# Patient Record
Sex: Male | Born: 1975 | Race: Black or African American | Hispanic: No | Marital: Single | Smoking: Current every day smoker
Health system: Southern US, Community
[De-identification: ages and names within clinical notes are randomized; demographics above are authoritative.]

---

## 2014-05-28 ENCOUNTER — Emergency Department (HOSPITAL_COMMUNITY)
Admission: EM | Admit: 2014-05-28 | Discharge: 2014-05-28 | Disposition: A | Discharge status: Home / Self Care | Payer: Self-pay | Attending: Emergency Medicine | Admitting: Emergency Medicine

## 2014-05-28 ENCOUNTER — Emergency Department (HOSPITAL_COMMUNITY): Payer: Self-pay

## 2014-05-28 ENCOUNTER — Encounter (HOSPITAL_COMMUNITY): Payer: Self-pay | Admitting: Emergency Medicine

## 2014-05-28 DIAGNOSIS — M549 Dorsalgia, unspecified: Secondary | ICD-10-CM | POA: Insufficient documentation

## 2014-05-28 DIAGNOSIS — L989 Disorder of the skin and subcutaneous tissue, unspecified: Secondary | ICD-10-CM | POA: Insufficient documentation

## 2014-05-28 DIAGNOSIS — Z720 Tobacco use: Secondary | ICD-10-CM | POA: Insufficient documentation

## 2014-05-28 DIAGNOSIS — R21 Rash and other nonspecific skin eruption: Secondary | ICD-10-CM | POA: Insufficient documentation

## 2014-05-28 DIAGNOSIS — R109 Unspecified abdominal pain: Secondary | ICD-10-CM | POA: Insufficient documentation

## 2014-05-28 LAB — CBC WITH DIFFERENTIAL/PLATELET
BASOS PCT: 0 % (ref 0–1)
Basophils Absolute: 0 10*3/uL (ref 0.0–0.1)
EOS ABS: 0.4 10*3/uL (ref 0.0–0.7)
Eosinophils Relative: 4 % (ref 0–5)
HCT: 44.5 % (ref 39.0–52.0)
Hemoglobin: 14.4 g/dL (ref 13.0–17.0)
Lymphocytes Relative: 28 % (ref 12–46)
Lymphs Abs: 3.1 10*3/uL (ref 0.7–4.0)
MCH: 28.3 pg (ref 26.0–34.0)
MCHC: 32.4 g/dL (ref 30.0–36.0)
MCV: 87.6 fL (ref 78.0–100.0)
MONOS PCT: 5 % (ref 3–12)
Monocytes Absolute: 0.5 10*3/uL (ref 0.1–1.0)
NEUTROS PCT: 63 % (ref 43–77)
Neutro Abs: 6.9 10*3/uL (ref 1.7–7.7)
PLATELETS: 226 10*3/uL (ref 150–400)
RBC: 5.08 MIL/uL (ref 4.22–5.81)
RDW: 12.7 % (ref 11.5–15.5)
WBC: 11 10*3/uL — ABNORMAL HIGH (ref 4.0–10.5)

## 2014-05-28 LAB — COMPREHENSIVE METABOLIC PANEL
ALT: 15 U/L (ref 0–53)
ANION GAP: 11 (ref 5–15)
AST: 23 U/L (ref 0–37)
Albumin: 4.1 g/dL (ref 3.5–5.2)
Alkaline Phosphatase: 63 U/L (ref 39–117)
BUN: 7 mg/dL (ref 6–23)
CALCIUM: 9.2 mg/dL (ref 8.4–10.5)
CO2: 26 mEq/L (ref 19–32)
Chloride: 102 mEq/L (ref 96–112)
Creatinine, Ser: 1.02 mg/dL (ref 0.50–1.35)
GFR calc Af Amer: >90 mL/min (ref >90)
GFR calc non Af Amer: >90 mL/min (ref >90)
Glucose, Bld: 78 mg/dL (ref 70–99)
Potassium: 3.7 mEq/L (ref 3.7–5.3)
SODIUM: 139 meq/L (ref 137–147)
TOTAL PROTEIN: 7.6 g/dL (ref 6.0–8.3)
Total Bilirubin: 0.2 mg/dL — ABNORMAL LOW (ref 0.3–1.2)

## 2014-05-28 LAB — URINALYSIS, ROUTINE W REFLEX MICROSCOPIC
Bilirubin Urine: NEGATIVE
Glucose, UA: NEGATIVE mg/dL
Hgb urine dipstick: NEGATIVE
Ketones, ur: NEGATIVE mg/dL
Leukocytes, UA: NEGATIVE
Nitrite: NEGATIVE
Protein, ur: NEGATIVE mg/dL
Specific Gravity, Urine: 1.006 (ref 1.005–1.030)
Urobilinogen, UA: 0.2 mg/dL (ref 0.0–1.0)
pH: 7 (ref 5.0–8.0)

## 2014-05-28 MED ORDER — ACYCLOVIR 400 MG PO TABS: 400.0000 mg | ORAL_TABLET | Freq: Three times a day (TID) | ORAL | Status: DC

## 2014-05-28 NOTE — ED Provider Notes (Signed)
CSN: [id number]     Arrival date & time 05/28/14  1817 History   First MD Initiated Contact with Patient 05/28/14 1933     Chief Complaint  Patient presents with  . Flank Pain  . Penile Discharge     (Consider location/radiation/quality/duration/timing/severity/associated sxs/prior Treatment) HPI Mark Richardson is a 38 y.o. male who presents to emergency department complaining of right flank pain. Patient states he began having pain 2 weeks ago, states at first pain was intermittent. Now pain is constant. Pain is not worsened with movement. Denies pain radiating in to the abdomen. Denies any nausea or vomiting, but states he has no appetite. Denies any urinary symptoms or hematuria. He states he did notice that he had some white penile discharge and a rash on the shaft of his penis. This started several days ago. Rash is painful. No history of the same. He did not try any medications prior to coming in.  History reviewed. No pertinent past medical history. History reviewed. No pertinent past surgical history. No family history on file. History  Substance Use Topics  . Smoking status: Current Every Day Smoker  . Smokeless tobacco: Not on file  . Alcohol Use: No    Review of Systems  Constitutional: Negative for fever and chills.  Respiratory: Negative for cough, chest tightness and shortness of breath.   Cardiovascular: Negative for chest pain, palpitations and leg swelling.  Gastrointestinal: Positive for abdominal pain. Negative for nausea, vomiting, diarrhea and abdominal distention.  Genitourinary: Positive for flank pain, discharge and genital sores. Negative for dysuria, urgency, frequency, hematuria and testicular pain.  Musculoskeletal: Positive for back pain. Negative for myalgias, arthralgias, neck pain and neck stiffness.  Skin: Negative for rash.  Allergic/Immunologic: Negative for immunocompromised state.  Neurological: Negative for dizziness, weakness,  light-headedness, numbness and headaches.      Allergies  Review of patient's allergies indicates no known allergies.  Home Medications   Prior to Admission medications   Not on File   BP 130/82 mmHg  Pulse 58  Temp(Src) 98.2 F (36.8 C) (Oral)  Resp 18  Ht 6\' 3"  (1.905 m)  Wt 225 lb (102.059 kg)  BMI 28.12 kg/m2  SpO2 98% Physical Exam  Constitutional: He is oriented to person, place, and time. He appears well-developed and well-nourished. No distress.  HENT:  Head: Normocephalic and atraumatic.  Eyes: Conjunctivae are normal.  Neck: Neck supple.  Cardiovascular: Normal rate, regular rhythm and normal heart sounds.   Pulmonary/Chest: Effort normal. No respiratory distress. He has no wheezes. He has no rales.  Abdominal: Soft. Bowel sounds are normal. He exhibits no distension. There is no tenderness. There is no rebound and no guarding.  No CVA tenderness bilaterally  Genitourinary:  vesicular and ulcerated rash to the shaft of the penis. Tender.   Musculoskeletal: He exhibits no edema.  Neurological: He is alert and oriented to person, place, and time.  Skin: Skin is warm and dry.  Nursing note and vitals reviewed.   ED Course  Procedures (including critical care time) Labs Review Labs Reviewed  CBC WITH DIFFERENTIAL - Abnormal; Notable for the following:    WBC 11.0 (*)    All other components within normal limits  COMPREHENSIVE METABOLIC PANEL - Abnormal; Notable for the following:    Total Bilirubin 0.2 (*)    All other components within normal limits  GC/CHLAMYDIA PROBE AMP  HERPES SIMPLEX VIRUS CULTURE  URINALYSIS, ROUTINE W REFLEX MICROSCOPIC  RPR  HIV ANTIBODY (ROUTINE TESTING)  Imaging Review Ct Abdomen Pelvis Wo Contrast  05/28/2014   CLINICAL DATA:  Right flank pain for the past 3 weeks  EXAM: CT ABDOMEN AND PELVIS WITHOUT CONTRAST  TECHNIQUE: Multidetector CT imaging of the abdomen and pelvis was performed following the standard protocol  without IV contrast.  COMPARISON:  None  FINDINGS: Lower chest:  No pleural effusion identified.  Hepatobiliary: No focal liver abnormality. The gallbladder is normal. No biliary dilatation.  Pancreas: Normal appearance of the pancreas.  Spleen: The spleen appears normal.  Adrenals/Urinary Tract: The adrenal glands are both normal. The kidneys are both normal. No nephrolithiasis or hydronephrosis. No hydroureter identified. The urinary bladder appears normal.  Stomach/Bowel: The stomach is within normal limits. The small bowel loops have a normal course and caliber. No obstruction. Normal appearance of the colon. Fusiform dilatation of the appendix is identified measuring up to 15 mm. There is debris and high attenuation material within the lumen of the appendix.  Vascular/Lymphatic: Normal appearance of the abdominal aorta. No enlarged retroperitoneal or mesenteric adenopathy. No enlarged pelvic or inguinal lymph nodes.  Reproductive: The prostate gland and seminal vesicles are on unremarkable.  Other: There is no free fluid or fluid collections identified within the abdomen or pelvis.  Musculoskeletal: Review of the visualized osseous structures are unremarkable.  IMPRESSION: 1. Marked fusiform dilatation of the appendix which measures up to 1.5 cm. Although there is no surrounding fat stranding and some foci of gas are noted within the lumen of the appendix, acute appendicitis is of primary concern. Careful clinical correlation is recommended. 2. No evidence for appendiceal perforation or abscess. 3. Negative for nephrolithiasis or obstructive uropathy.   Electronically Signed   By: Signa Kellaylor  Stroud M.D.   On: 05/28/2014 21:20     EKG Interpretation None      MDM   Final diagnoses:  Flank pain  Penile rash    Pt with right flank pain, intermittent for several weeks, worse within last few days. Some penile rash as well. Pt has no abdominal tenderness. Will check US, labs, CT abd for stone evaluation.     10:22 PM CT scan showing possible appendicitis although no straining around the appendix. I reassessed the patient, no right lower quadrant tenderness. In fact no tenderness over entire abdomen. Patient is nontoxic-appearing. I discussed case with Dr. Lindie SpruceWyatt for general surgery, who advised to discharge patient to bring him back in 24 hours for recheck. I discussed this with patient, he is agreeable to this treatment. I have also decided to treat patient with a cycle of year for his penile rash. I did collect some viral cultures. GC chlamydia sent as well. Urinalysis unremarkable. Will discharge home and bring him back in 24 hours for recheck. Term precautions discussed with patient  Filed Vitals:   05/28/14 2145  BP: 133/84  Pulse: 50  Temp:   Resp: [address]15     Vyolet Sakuma A Taiwan Millon, PA-C 05/29/14 0026  Shon Batonourtney F Horton, MD 05/29/14 1210

## 2014-05-28 NOTE — ED Notes (Signed)
Pt to ED for evaluation of right flank pain for the past 3 weeks, denies pain with urination or N/V.  Pt would also like to be evaluated for red bumps to genital area and penile discharge.

## 2014-05-28 NOTE — ED Notes (Addendum)
Pt A&OX4, ambulatory at d/c with steady gait, NAD, and states he understands discharge instructions and follow up care.

## 2014-05-28 NOTE — Discharge Instructions (Signed)
Acyclovir for possible herpes infection. Take for 10 days. Your cultures are pending.  RETURN TOMORROW AFTERNOON for recheck of your abdominal pain. Return earlier if worsening.    Flank Pain Flank pain refers to pain that is located on the side of the body between the upper abdomen and the back. The pain may occur over a short period of time (acute) or may be long-term or reoccurring (chronic). It may be mild or severe. Flank pain can be caused by many things. CAUSES  Some of the more common causes of flank pain include:  Muscle strains.   Muscle spasms.   A disease of your spine (vertebral disk disease).   A lung infection (pneumonia).   Fluid around your lungs (pulmonary edema).   A kidney infection.   Kidney stones.   A very painful skin rash caused by the chickenpox virus (shingles).   Gallbladder disease.  HOME CARE INSTRUCTIONS  Home care will depend on the cause of your pain. In general,  Rest as directed by your caregiver.  Drink enough fluids to keep your urine clear or pale yellow.  Only take over-the-counter or prescription medicines as directed by your caregiver. Some medicines may help relieve the pain.  Tell your caregiver about any changes in your pain.  Follow up with your caregiver as directed. SEEK IMMEDIATE MEDICAL CARE IF:   Your pain is not controlled with medicine.   You have new or worsening symptoms.  Your pain increases.   You have abdominal pain.   You have shortness of breath.   You have persistent nausea or vomiting.   You have swelling in your abdomen.   You feel faint or pass out.   You have blood in your urine.  You have a fever or persistent symptoms for more than 2-3 days.  You have a fever and your symptoms suddenly get worse. MAKE SURE YOU:   Understand these instructions.  Will watch your condition.  Will get help right away if you are not doing well or get worse. Document Released: 08/13/2005  Document Revised: 03/16/2012 Document Reviewed: 02/04/2012 ExitCare Patient Information 2015 ExitCare, L Genital Herpes Genital herpes is a sexually transmitted disease. This means that it is a disease passed by having sex with an infected person. There is no cure for genital herpes. The time between attacks can be months to years. The virus may live in a person but produce no problems (symptoms). This infection can be passed to a baby as it travels down the birth canal (vagina). In a newborn, this can cause central nervous system damage, eye damage, or even death. The virus that causes genital herpes is usually HSV-2 virus. The virus that causes oral herpes is usually HSV-1. The diagnosis (learning what is wrong) is made through culture results. SYMPTOMS  Usually symptoms of pain and itching begin a few days to a week after contact. It first appears as small blisters that progress to small painful ulcers which then scab over and heal after several days. It affects the outer genitalia, birth canal, cervix, penis, anal area, buttocks, and thighs. HOME CARE INSTRUCTIONS   Keep ulcerated areas dry and clean.  Take medications as directed. Antiviral medications can speed up healing. They will not prevent recurrences or cure this infection. These medications can also be taken for suppression if there are frequent recurrences.  While the infection is active, it is contagious. Avoid all sexual contact during active infections.  Condoms may help prevent spread of the herpes  virus.  Practice safe sex.  Wash your hands thoroughly after touching the genital area.  Avoid touching your eyes after touching your genital area.  Inform your caregiver if you have had genital herpes and become pregnant. It is your responsibility to insure a safe outcome for your baby in this pregnancy.  Only take over-the-counter or prescription medicines for pain, discomfort, or fever as directed by your caregiver. SEEK  MEDICAL CARE IF:   You have a recurrence of this infection.  You do not respond to medications and are not improving.  You have new sources of pain or discharge which have changed from the original infection.  You have an oral temperature above 102 F (38.9 C).  You develop abdominal pain.  You develop eye pain or signs of eye infection. Document Released: 06/19/2000 Document Revised: 09/14/2011 Document Reviewed: 07/10/2009 [facility] Patient Information 2015 [place], [place]. This information is not intended to replace advice given to you by your health care provider. Make sure you discuss any questions you have with your health care provider. LC. This information is not intended to replace advice given to you by your health care provider. Make sure you discuss any questions you have with your health care provider.

## 2014-05-28 NOTE — ED Notes (Signed)
Assisited Tatyana, PA with genital exam and swab. Specimen sent down to lab, pt tolerated procedure well.

## 2014-05-30 LAB — RPR

## 2014-05-30 LAB — HIV ANTIBODY (ROUTINE TESTING W REFLEX): HIV 1&2 Ab, 4th Generation: NONREACTIVE

## 2014-06-05 LAB — GC/CHLAMYDIA PROBE AMP
CT PROBE, AMP APTIMA: NEGATIVE
GC PROBE AMP APTIMA: NEGATIVE

## 2014-06-06 ENCOUNTER — Emergency Department (HOSPITAL_COMMUNITY): Payer: Self-pay

## 2014-06-06 ENCOUNTER — Encounter (HOSPITAL_COMMUNITY): Payer: Self-pay | Admitting: *Deleted

## 2014-06-06 ENCOUNTER — Emergency Department (HOSPITAL_COMMUNITY)
Admission: EM | Admit: 2014-06-06 | Discharge: 2014-06-07 | Disposition: A | Discharge status: Home / Self Care | Payer: Self-pay | Attending: Emergency Medicine | Admitting: Emergency Medicine

## 2014-06-06 DIAGNOSIS — K389 Disease of appendix, unspecified: Secondary | ICD-10-CM

## 2014-06-06 DIAGNOSIS — R1011 Right upper quadrant pain: Secondary | ICD-10-CM

## 2014-06-06 DIAGNOSIS — M549 Dorsalgia, unspecified: Secondary | ICD-10-CM | POA: Insufficient documentation

## 2014-06-06 DIAGNOSIS — Z720 Tobacco use: Secondary | ICD-10-CM | POA: Insufficient documentation

## 2014-06-06 DIAGNOSIS — R1031 Right lower quadrant pain: Secondary | ICD-10-CM

## 2014-06-06 DIAGNOSIS — K388 Other specified diseases of appendix: Secondary | ICD-10-CM | POA: Insufficient documentation

## 2014-06-06 LAB — COMPREHENSIVE METABOLIC PANEL
ALBUMIN: 4.1 g/dL (ref 3.5–5.2)
ALK PHOS: 63 U/L (ref 39–117)
ALT: 23 U/L (ref 0–53)
AST: 27 U/L (ref 0–37)
Anion gap: 13 (ref 5–15)
BILIRUBIN TOTAL: 0.2 mg/dL — AB (ref 0.3–1.2)
BUN: 10 mg/dL (ref 6–23)
CHLORIDE: 101 meq/L (ref 96–112)
CO2: 26 mEq/L (ref 19–32)
Calcium: 9.1 mg/dL (ref 8.4–10.5)
Creatinine, Ser: 1.1 mg/dL (ref 0.50–1.35)
GFR calc Af Amer: >90 mL/min (ref >90)
GFR calc non Af Amer: 84 mL/min — ABNORMAL LOW (ref >90)
Glucose, Bld: 107 mg/dL — ABNORMAL HIGH (ref 70–99)
Potassium: 4.2 mEq/L (ref 3.7–5.3)
SODIUM: 140 meq/L (ref 137–147)
TOTAL PROTEIN: 7.5 g/dL (ref 6.0–8.3)

## 2014-06-06 LAB — CBC WITH DIFFERENTIAL/PLATELET
BASOS ABS: 0 10*3/uL (ref 0.0–0.1)
BASOS PCT: 0 % (ref 0–1)
EOS ABS: 0.3 10*3/uL (ref 0.0–0.7)
Eosinophils Relative: 3 % (ref 0–5)
HCT: 41.6 % (ref 39.0–52.0)
Hemoglobin: 13.6 g/dL (ref 13.0–17.0)
Lymphocytes Relative: 27 % (ref 12–46)
Lymphs Abs: 3.5 10*3/uL (ref 0.7–4.0)
MCH: 28.2 pg (ref 26.0–34.0)
MCHC: 32.7 g/dL (ref 30.0–36.0)
MCV: 86.1 fL (ref 78.0–100.0)
Monocytes Absolute: 0.9 10*3/uL (ref 0.1–1.0)
Monocytes Relative: 7 % (ref 3–12)
NEUTROS ABS: 8 10*3/uL — AB (ref 1.7–7.7)
NEUTROS PCT: 63 % (ref 43–77)
PLATELETS: 236 10*3/uL (ref 150–400)
RBC: 4.83 MIL/uL (ref 4.22–5.81)
RDW: 13 % (ref 11.5–15.5)
WBC: 12.8 10*3/uL — ABNORMAL HIGH (ref 4.0–10.5)

## 2014-06-06 LAB — URINALYSIS, ROUTINE W REFLEX MICROSCOPIC
BILIRUBIN URINE: NEGATIVE
Glucose, UA: NEGATIVE mg/dL
HGB URINE DIPSTICK: NEGATIVE
Ketones, ur: NEGATIVE mg/dL
Leukocytes, UA: NEGATIVE
NITRITE: NEGATIVE
PH: 6 (ref 5.0–8.0)
Protein, ur: NEGATIVE mg/dL
Specific Gravity, Urine: 1.006 (ref 1.005–1.030)
UROBILINOGEN UA: 0.2 mg/dL (ref 0.0–1.0)

## 2014-06-06 LAB — LIPASE, BLOOD: Lipase: 15 U/L (ref 11–59)

## 2014-06-06 MED ORDER — IOHEXOL 300 MG/ML  SOLN: 25.0000 mL | Freq: Once | INTRAMUSCULAR | Status: AC | PRN

## 2014-06-06 MED ORDER — IOHEXOL 300 MG/ML  SOLN
100.0000 mL | Freq: Once | INTRAMUSCULAR | Status: AC | PRN
  Administered 2014-06-06: 100 mL via INTRAVENOUS

## 2014-06-06 NOTE — ED Notes (Signed)
The pt is  C/o  Rt mid abd pain for one week.  He was seen here and told to return if it did noit go away.  No n v or diarrhea.  intermitent tempo

## 2014-06-06 NOTE — ED Notes (Signed)
[facility] from [employer] taken patient

## 2014-06-06 NOTE — ED Notes (Signed)
CT called and informed pt has finished his contrast.  

## 2014-06-06 NOTE — ED Notes (Signed)
Patient transported to Ultrasound 

## 2014-06-06 NOTE — ED Provider Notes (Signed)
CSN: [id number]     Arrival date & time 06/06/14  1951 History   First MD Initiated Contact with Patient 06/06/14 2037     Chief Complaint  Patient presents with  . Abdominal Pain     (Consider location/radiation/quality/duration/timing/severity/associated sxs/prior Treatment) HPI Comments: Patient reports through history of constant right sided flank and abdominal pain. He was seen on November 23 for similar complaints and found to have a dilated appendix. He did not return for follow-up as suggested. He states the pain is constant and nothing makes it better or worse. He denies any fever, nausea, vomiting. Denies any pain with urination. Denies any testicular pain. No diarrhea. He still has a good appetite.  The history is provided by the patient.    History reviewed. No pertinent past medical history. History reviewed. No pertinent past surgical history. No family history on file. History  Substance Use Topics  . Smoking status: Current Every Day Smoker  . Smokeless tobacco: Not on file  . Alcohol Use: No    Review of Systems  Constitutional: Negative for fever, activity change and appetite change.  HENT: Negative for congestion and rhinorrhea.   Respiratory: Negative for cough, chest tightness and shortness of breath.   Cardiovascular: Negative for chest pain.  Gastrointestinal: Positive for abdominal pain. Negative for nausea and vomiting.  Genitourinary: Negative for dysuria, hematuria and testicular pain.  Musculoskeletal: Positive for back pain. Negative for myalgias and arthralgias.  Skin: Negative for wound.  Neurological: Negative for dizziness, weakness and headaches.  A complete 10 system review of systems was obtained and all systems are negative except as noted in the HPI and PMH.      Allergies  Review of patient's allergies indicates no known allergies.  Home Medications   Prior to Admission medications   Not on File   BP 126/83 mmHg  Pulse 62   Temp(Src) 98.2 F (36.8 C)  Resp 15  Ht 6\' 5"  (1.956 m)  Wt 222 lb 3 oz (100.784 kg)  BMI 26.34 kg/m2  SpO2 98% Physical Exam  Constitutional: He is oriented to person, place, and time. He appears well-developed and well-nourished. No distress.  HENT:  Head: Normocephalic and atraumatic.  Mouth/Throat: Oropharynx is clear and moist. No oropharyngeal exudate.  Eyes: Conjunctivae and EOM are normal. Pupils are equal, round, and reactive to light.  Neck: Normal range of motion. Neck supple.  No meningismus.  Cardiovascular: Normal rate, regular rhythm, normal heart sounds and intact distal pulses.   No murmur heard. Pulmonary/Chest: Effort normal and breath sounds normal. No respiratory distress.  Abdominal: Soft. There is tenderness. There is no rebound and no guarding.  TTP RUQ and RLQ. No guarding or rebound  Genitourinary:  No testicular tenderness  Musculoskeletal: Normal range of motion. He exhibits no edema or tenderness.  No CVAT  Neurological: He is alert and oriented to person, place, and time. No cranial nerve deficit. He exhibits normal muscle tone. Coordination normal.  No ataxia on finger to nose bilaterally. No pronator drift. 5/5 strength throughout. CN 2-12 intact. Negative Romberg. Equal grip strength. Sensation intact. Gait is normal.   Skin: Skin is warm.  Psychiatric: He has a normal mood and affect. His behavior is normal.  Nursing note and vitals reviewed.   ED Course  Procedures (including critical care time) Labs Review Labs Reviewed  CBC WITH DIFFERENTIAL - Abnormal; Notable for the following:    WBC 12.8 (*)    Neutro Abs 8.0 (*)  All other components within normal limits  COMPREHENSIVE METABOLIC PANEL - Abnormal; Notable for the following:    Glucose, Bld 107 (*)    Total Bilirubin 0.2 (*)    GFR calc non Af Amer 84 (*)    All other components within normal limits  LIPASE, BLOOD  URINALYSIS, ROUTINE W REFLEX MICROSCOPIC    Imaging Review Ct  Abdomen Pelvis W Contrast  06/06/2014   CLINICAL DATA:  38 year old male with right abdominal and pelvic pain for 1 week. Initial encounter.  EXAM: CT ABDOMEN AND PELVIS WITH CONTRAST  TECHNIQUE: Multidetector CT imaging of the abdomen and pelvis was performed using the standard protocol following bolus administration of intravenous contrast.  CONTRAST:  100mL OMNIPAQUE IOHEXOL 300 MG/ML  SOLN  COMPARISON:  05/28/2014 CT  FINDINGS: The liver, gallbladder, spleen, pancreas, adrenal glands, and kidneys are unremarkable.  There is no evidence of free fluid, enlarged lymph nodes, biliary dilation or abdominal aortic aneurysm.  Again noted is focal enlargement of the mid-distal appendix without involvement of the tip. No adjacent inflammation is identified. Although this could be unusual appearance of a normal appendix, early appendiceal mass is difficult to exclude.  The remainder of the bowel is unremarkable.  There is no evidence of bowel obstruction, pneumoperitoneum or abscess.  Bladder is unremarkable.  No acute or suspicious bony abnormalities are identified.  A broad-based disc-osteophyte complex at L4-5 causes moderate central spinal and lateral recess narrowing.  IMPRESSION: No evidence of acute abnormality.  Focal enlargement of the mid-distal appendix without evidence of appendicitis. Although this could represent an unusual appearance of a normal appendix, focal wall thickening/ early appendiceal mass is not excluded. Consider short term CT follow-up or surgical consultation.  Broad-based disc-osteophyte complex at L4-5 causes moderate central spinal lateral recess narrowing.   Electronically Signed   By: Laveda AbbeJeff  Hu M.D.   On: 06/06/2014 22:30   [place] Abdomen Limited Ruq  06/07/2014   CLINICAL DATA:  Acute onset of right upper quadrant abdominal pain. Initial encounter.  EXAM: US ABDOMEN LIMITED - RIGHT UPPER QUADRANT  COMPARISON:  None.  FINDINGS: Gallbladder:  No gallstones or wall thickening visualized. No  sonographic Murphy sign noted.  Common bile duct:  Diameter: 0.4 cm, within normal limits in caliber.  Liver:  No focal lesion identified. Within normal limits in parenchymal echogenicity.  IMPRESSION: Unremarkable ultrasound of the right upper quadrant.   Electronically Signed   By: Roanna RaiderJeffery  Chang M.D.   On: 06/07/2014 00:38     EKG Interpretation None      MDM   Final diagnoses:  RLQ abdominal pain  RUQ pain  Appendix disease   Ongoing right-sided abdominal pain with dilated appendix on previous CT. No pain at McBurney's point currently. Discomfort on right side of abdomen and flank.  WBC 12.8.  UA negative. RUQ US negative.  CT scan reviewed there is area of dilated appendix similar to previous. Reviewed with Dr. Luisa Hartornett who reviewed scan and feels it is similar to his previous. Recommends outpatient follow-up for laparoscopy. He feels patient may have carcinoid.  Patient to follow-up with surgery clinic. return precautions discussed.  Glynn OctaveStephen Oneika Simonian, MD 06/07/14 [phone]

## 2014-06-06 NOTE — ED Notes (Signed)
MD at bedside. 

## 2014-06-07 NOTE — ED Notes (Signed)
Patient is alert and orientedx4.  Patient was explained discharge instructions and they understood them with no questions.  The patient's girlfriend, Heron SabinsShakeema Graves is taking the patient home.

## 2014-06-07 NOTE — Discharge Instructions (Signed)
Abdominal Pain Your appendix is abnormal and you need to follow up with the surgeons. Return to the ED if you develop new or worsening symptoms. Many things can cause abdominal pain. Usually, abdominal pain is not caused by a disease and will improve without treatment. It can often be observed and treated at home. Your health care provider will do a physical exam and possibly order blood tests and X-rays to help determine the seriousness of your pain. However, in many cases, more time must pass before a clear cause of the pain can be found. Before that point, your health care provider may not know if you need more testing or further treatment. HOME CARE INSTRUCTIONS  Monitor your abdominal pain for any changes. The following actions may help to alleviate any discomfort you are experiencing:  Only take over-the-counter or prescription medicines as directed by your health care provider.  Do not take laxatives unless directed to do so by your health care provider.  Try a clear liquid diet (broth, tea, or water) as directed by your health care provider. Slowly move to a bland diet as tolerated. SEEK MEDICAL CARE IF:  You have unexplained abdominal pain.  You have abdominal pain associated with nausea or diarrhea.  You have pain when you urinate or have a bowel movement.  You experience abdominal pain that wakes you in the night.  You have abdominal pain that is worsened or improved by eating food.  You have abdominal pain that is worsened with eating fatty foods.  You have a fever. SEEK IMMEDIATE MEDICAL CARE IF:   Your pain does not go away within 2 hours.  You keep throwing up (vomiting).  Your pain is felt only in portions of the abdomen, such as the right side or the left lower portion of the abdomen.  You pass bloody or black tarry stools. MAKE SURE YOU:  Understand these instructions.   Will watch your condition.   Will get help right away if you are not doing well or get  worse.  Document Released: 04/01/2005 Document Revised: 06/27/2013 Document Reviewed: 03/01/2013 [facility] LLCExitCare Patient Information 2015 [place]ExitCare, [place]LL[place]. This information is not intended to replace advice given to you by your health care provider. Make sure you discuss any questions you have with your health care provider.

## 2014-06-08 LAB — HERPES SIMPLEX VIRUS CULTURE: Culture: NOT DETECTED

## 2016-08-30 IMAGING — CT CT ABD-PELV W/O CM
2 of 4 series · 5 of 46 positions shown, 7 images · non-contrast
Comparison: None

CLINICAL DATA: Right flank pain for the past 3 weeks

EXAM:
CT ABDOMEN AND PELVIS WITHOUT CONTRAST
TECHNIQUE: Multidetector CT imaging of the abdomen and pelvis was performed
following the standard protocol without IV contrast.

[Series 204: cor · coronal · 0.50mm/px · 4 of 126 slices shown, 5 images]
[im 28/126  soft-tissue]
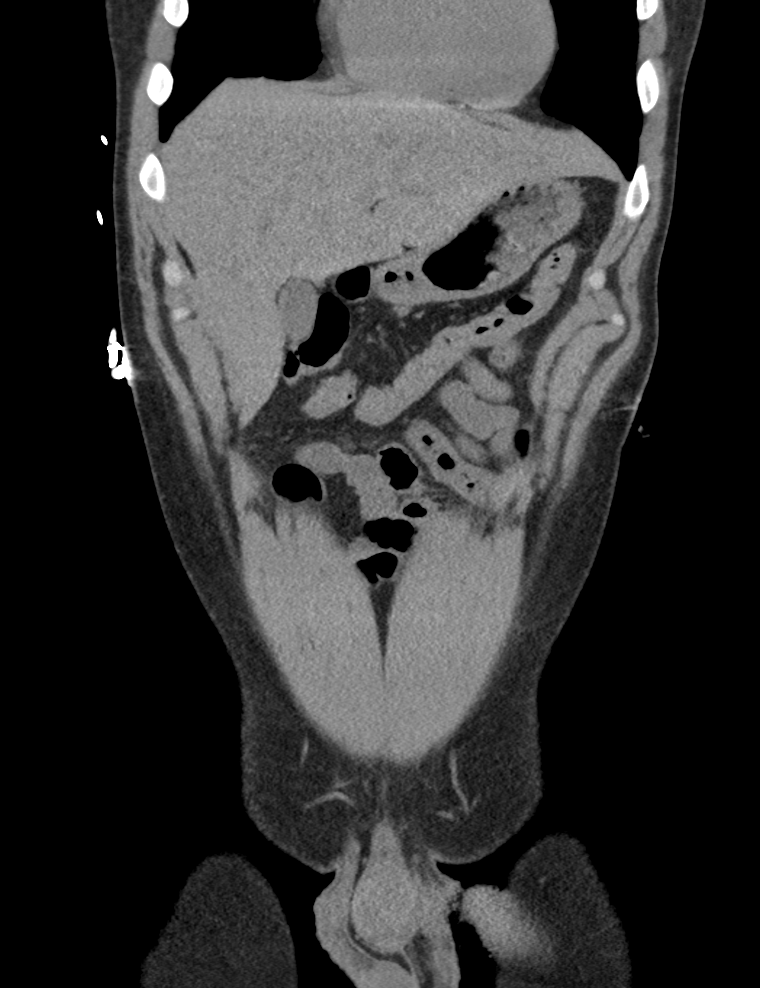
[im 28/126  bone]
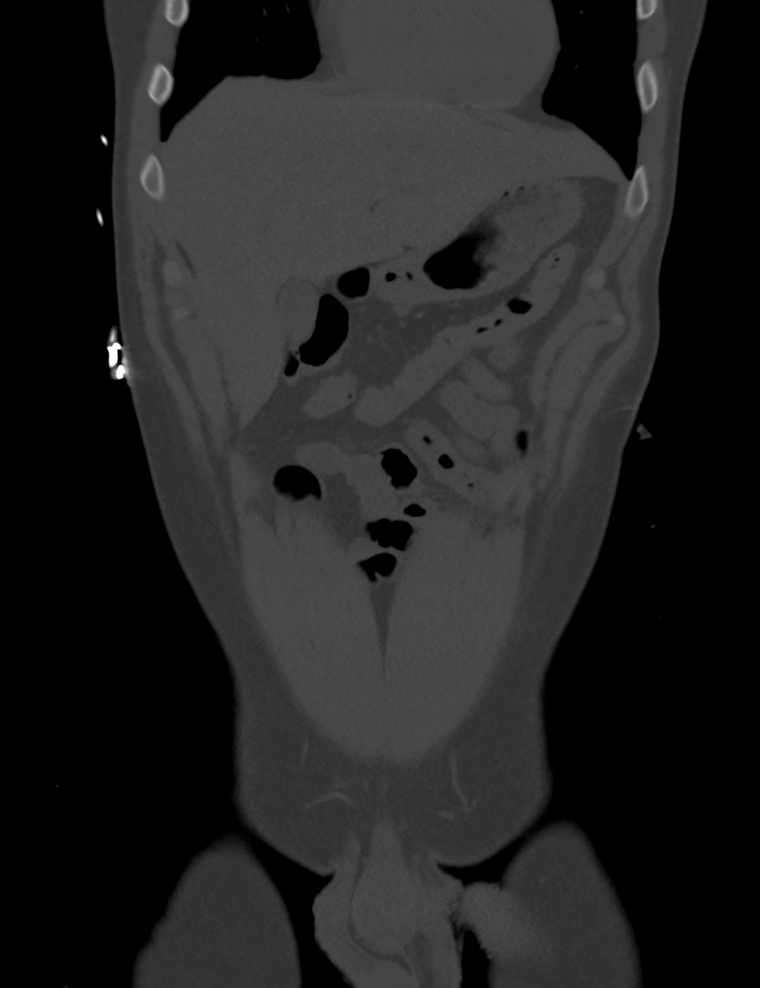
[im 56/126  soft-tissue]
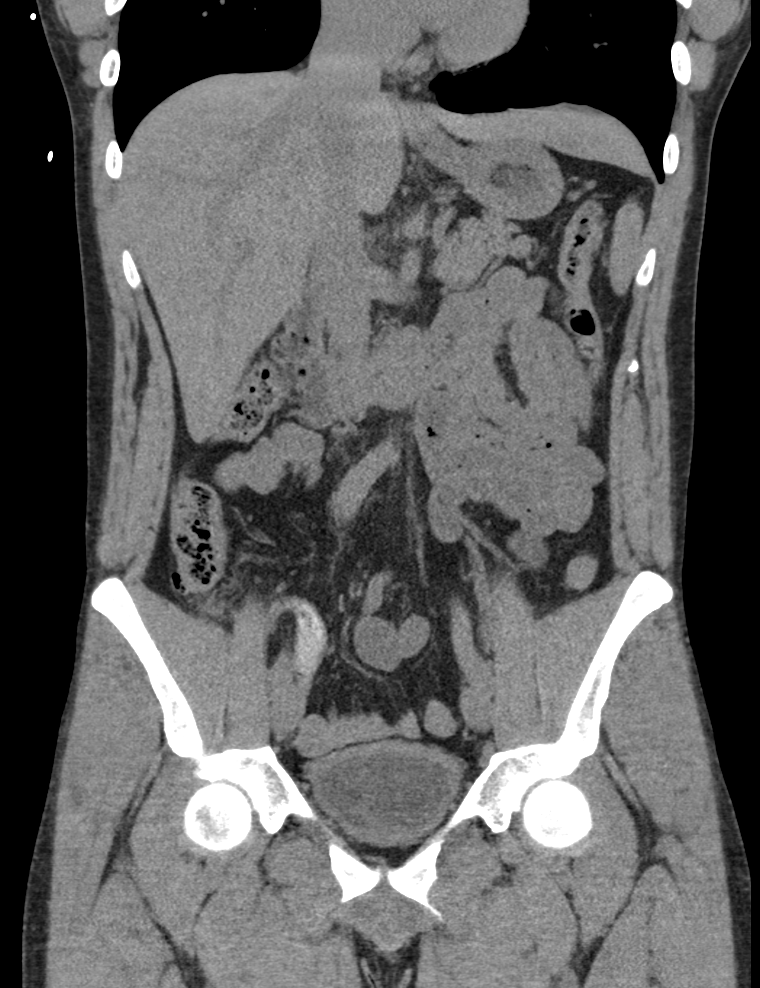
[im 84/126  soft-tissue]
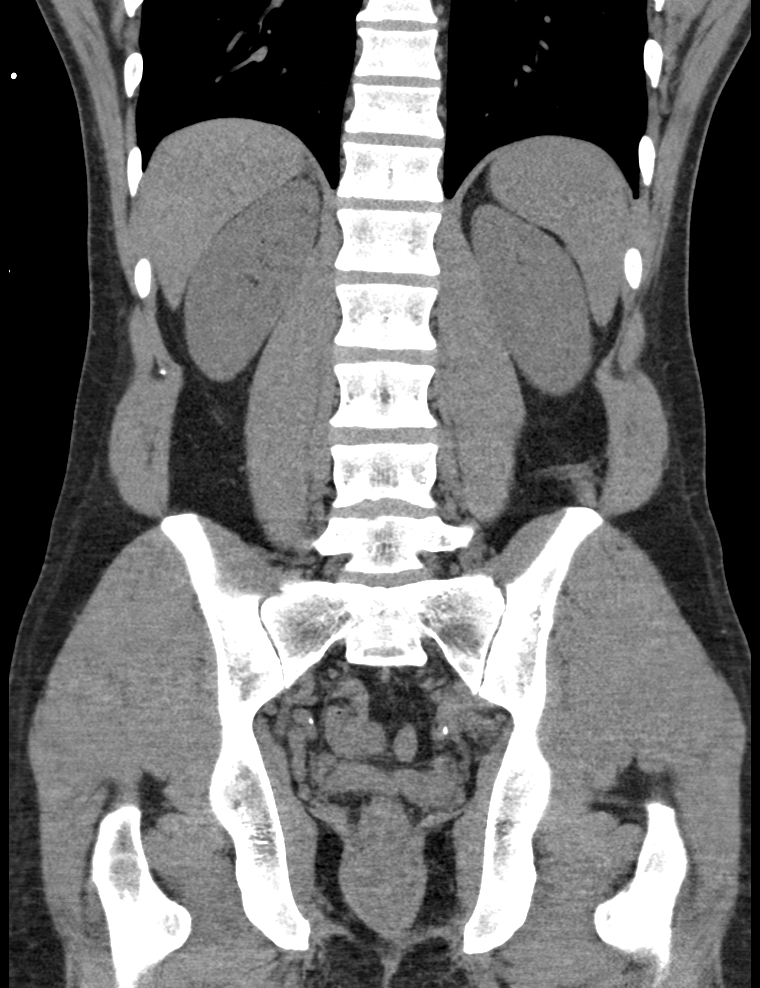
[im 112/126  soft-tissue]
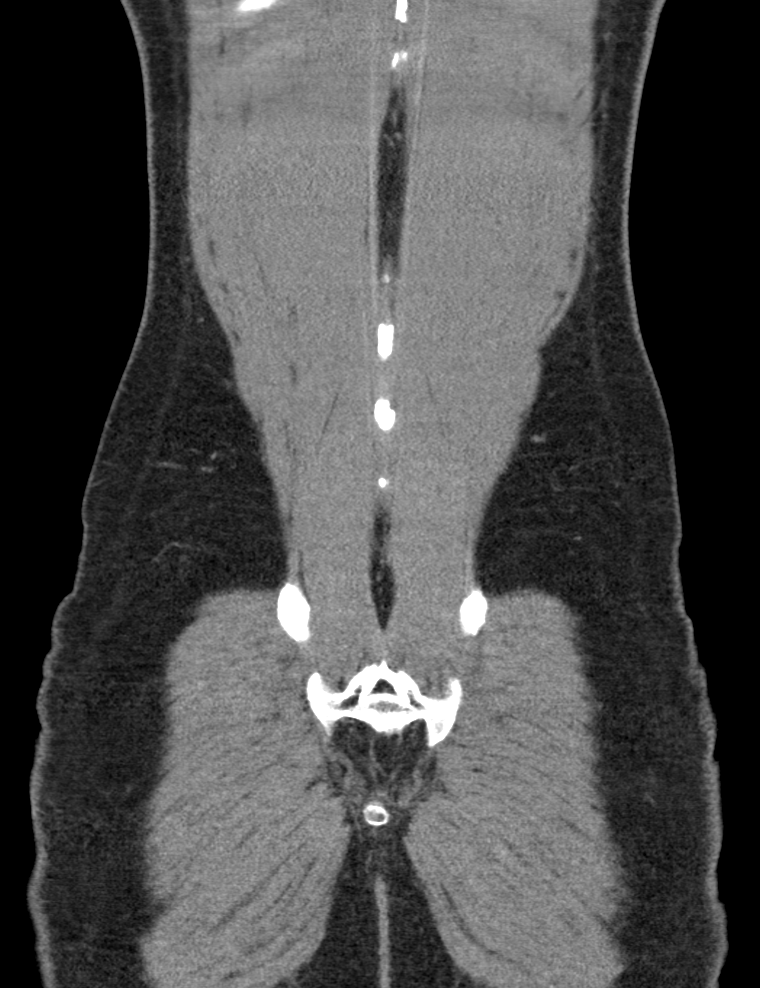

[Series 205: sag · sagittal · 0.50mm/px · 1 of 155 slices shown, 2 images]
[im 52/155  soft-tissue]
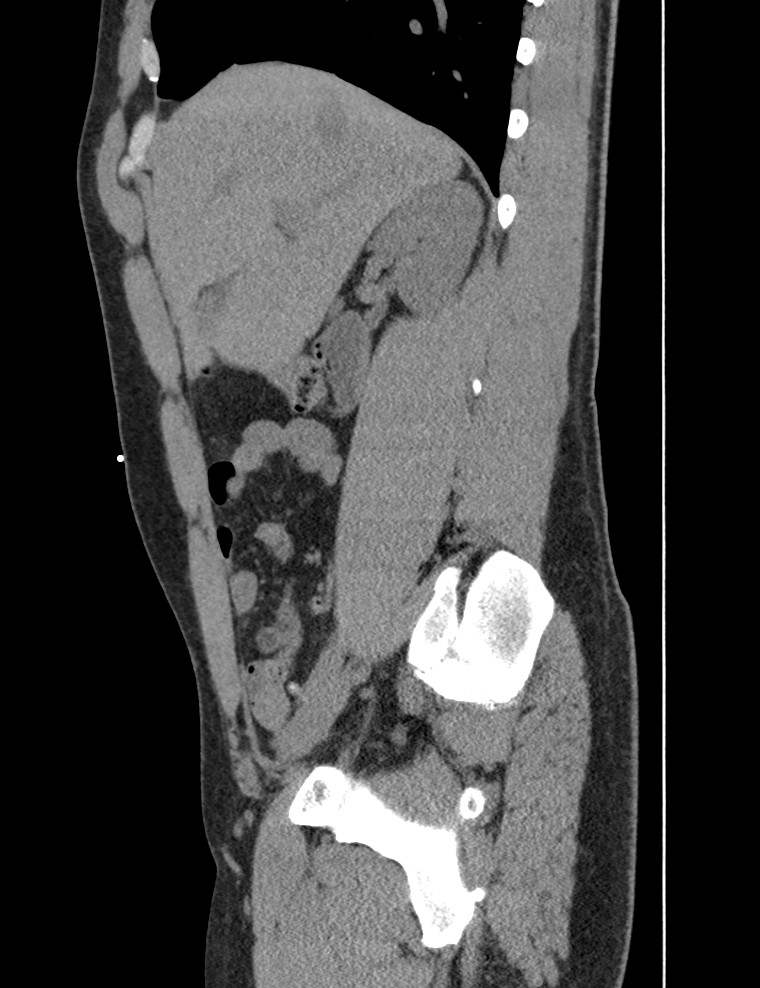
[im 52/155  bone]
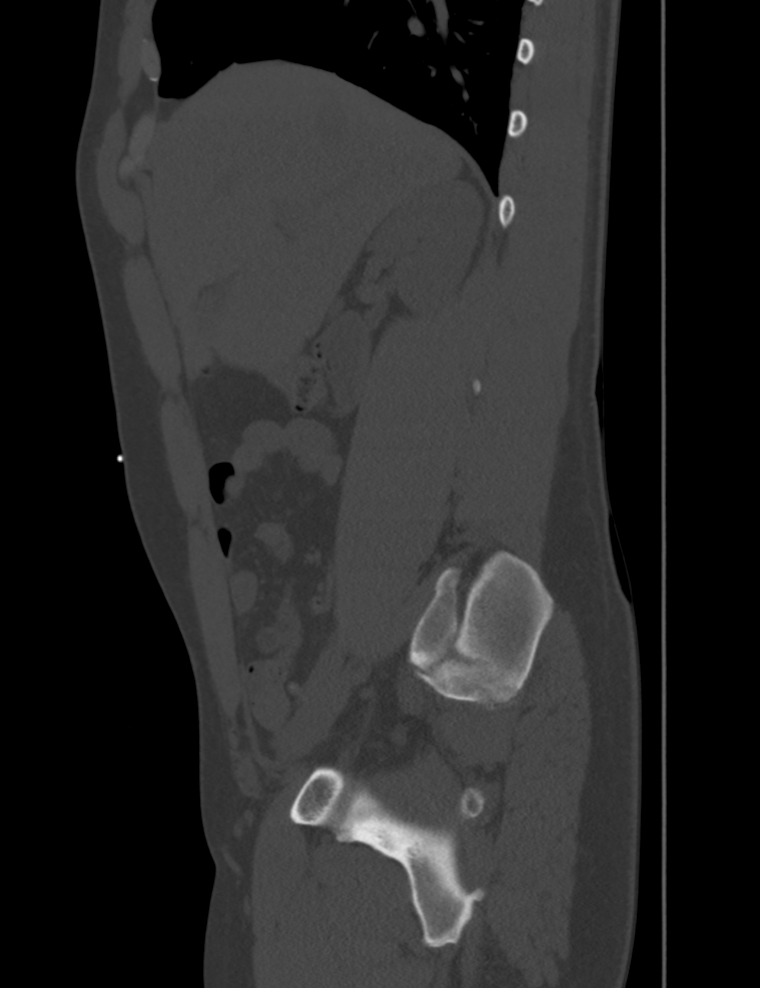

[5 of 46 positions shown; findings below may reference images not displayed]

FINDINGS: Lower chest:  No pleural effusion identified.

Hepatobiliary: No focal liver abnormality. The gallbladder is
normal. No biliary dilatation.

Pancreas: Normal appearance of the pancreas.

Spleen: The spleen appears normal.

Adrenals/Urinary Tract: The adrenal glands are both normal. The
kidneys are both normal. No nephrolithiasis or hydronephrosis. No
hydroureter identified. The urinary bladder appears normal.

Stomach/Bowel: The stomach is within normal limits. The small bowel
loops have a normal course and caliber. No obstruction. Normal
appearance of the colon. Fusiform dilatation of the appendix is
identified measuring up to 15 mm. There is debris and high
attenuation material within the lumen of the appendix.

Vascular/Lymphatic: Normal appearance of the abdominal aorta. No
enlarged retroperitoneal or mesenteric adenopathy. No enlarged
pelvic or inguinal lymph nodes.

Reproductive: The prostate gland and seminal vesicles are on
unremarkable.

Other: There is no free fluid or fluid collections identified within
the abdomen or pelvis.

Musculoskeletal: Review of the visualized osseous structures are
unremarkable.
IMPRESSION: 1. Marked fusiform dilatation of the appendix which measures up to
1.5 cm. Although there is no surrounding fat stranding and some foci
of gas are noted within the lumen of the appendix, acute
appendicitis is of primary concern. Careful clinical correlation is
recommended.
2. No evidence for appendiceal perforation or abscess.
3. Negative for nephrolithiasis or obstructive uropathy.

## 2016-09-08 IMAGING — US US ABDOMEN LIMITED
1 series · 14 of 24 positions shown · non-contrast
Comparison: None.

CLINICAL DATA: Acute onset of right upper quadrant abdominal pain.
Initial encounter.

EXAM:
US ABDOMEN LIMITED - RIGHT UPPER QUADRANT

[Series 1: us abdomen limited · 0.26mm/px · 14 of 24 slices shown]
[im 1/24]
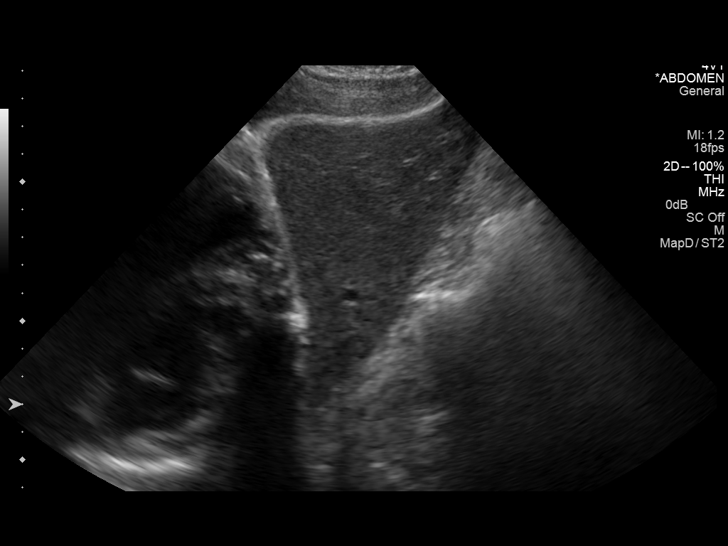
[im 3/24]
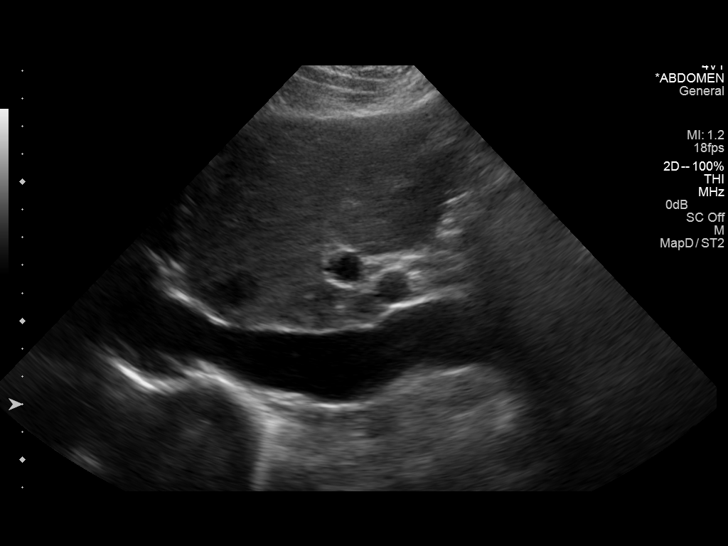
[im 5/24]
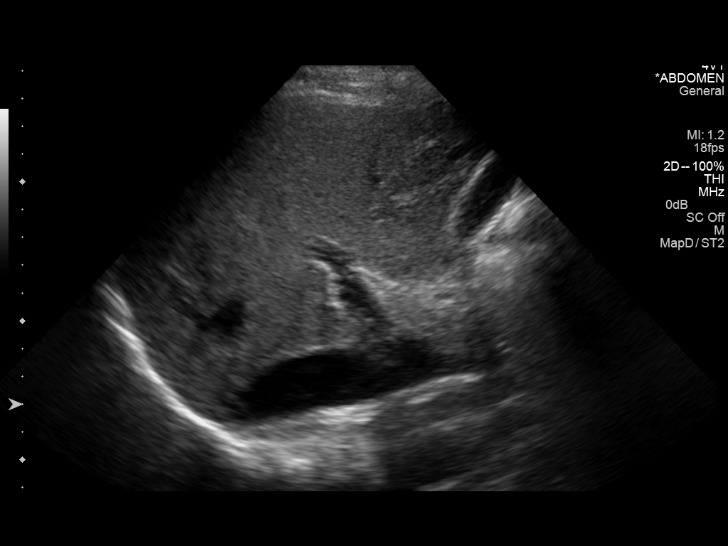
[im 7/24]
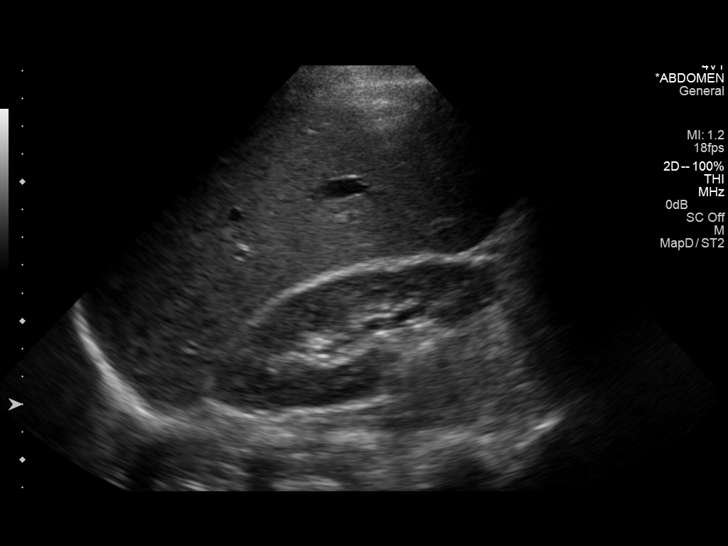
[im 8/24]
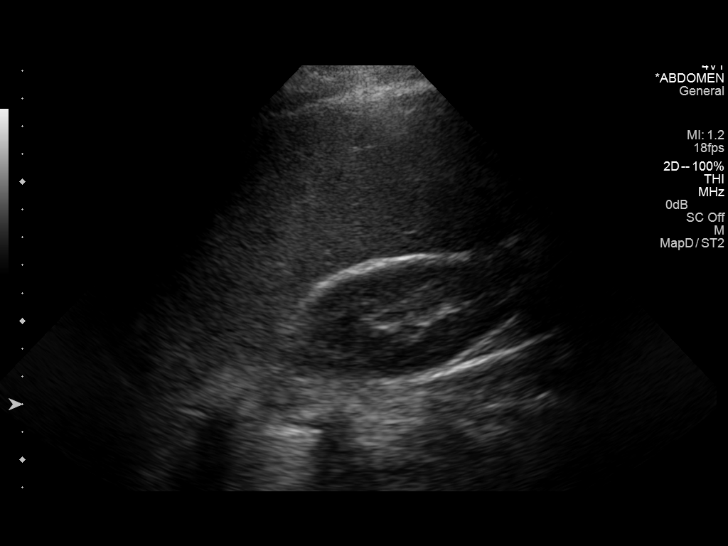
[im 10/24]
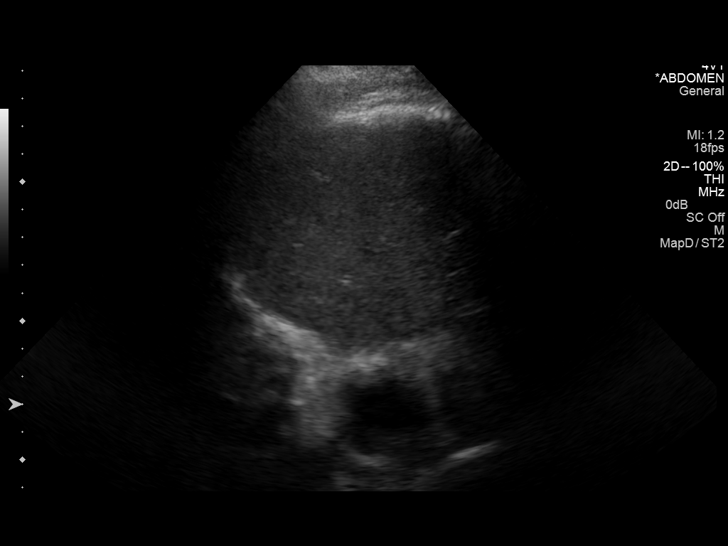
[im 12/24]
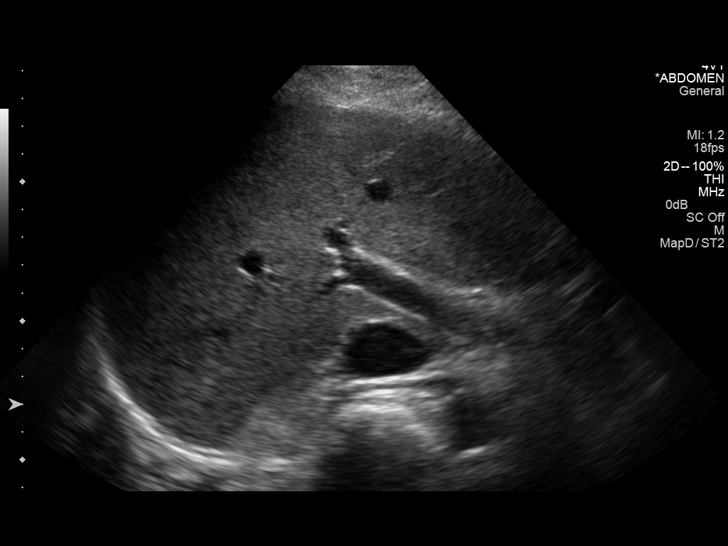
[im 13/24]
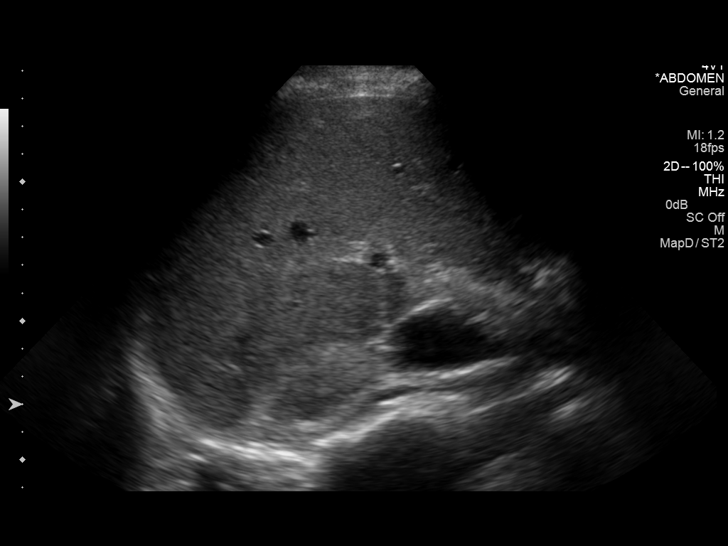
[im 15/24]
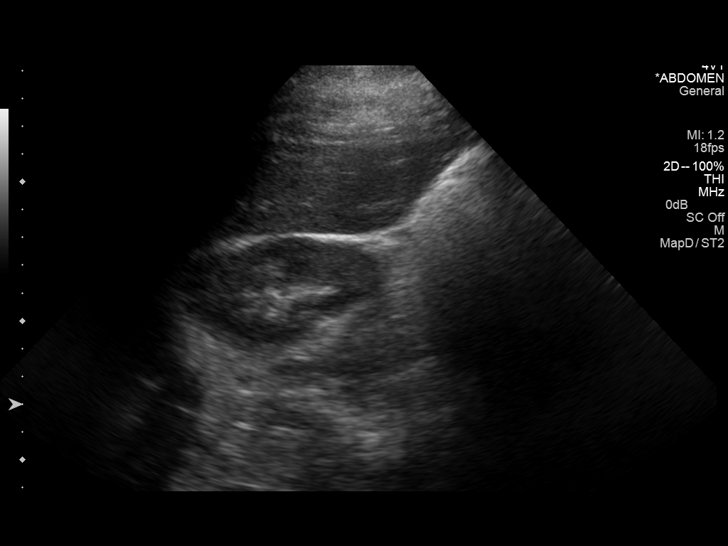
[im 17/24]
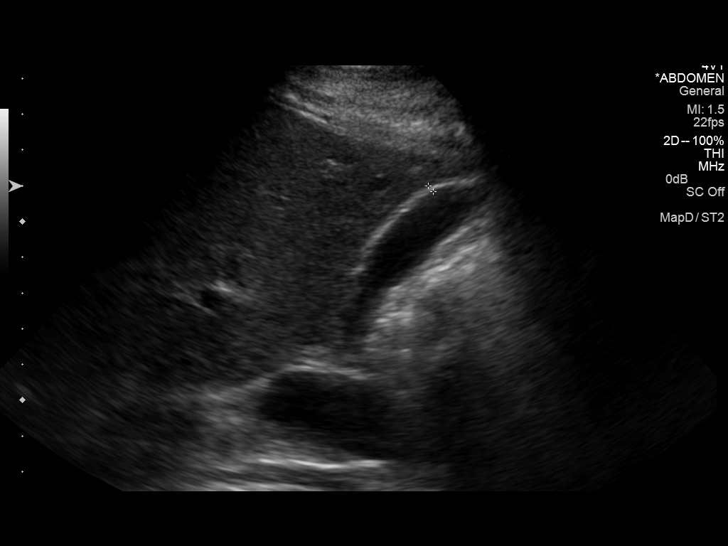
[im 19/24]
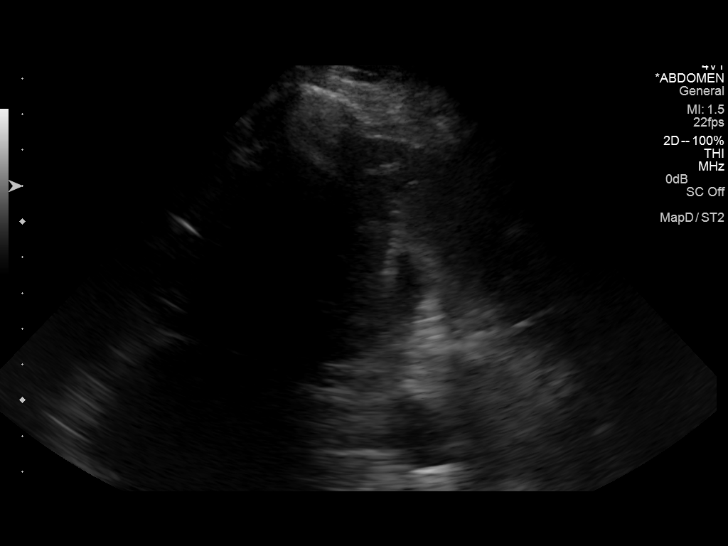
[im 20/24]
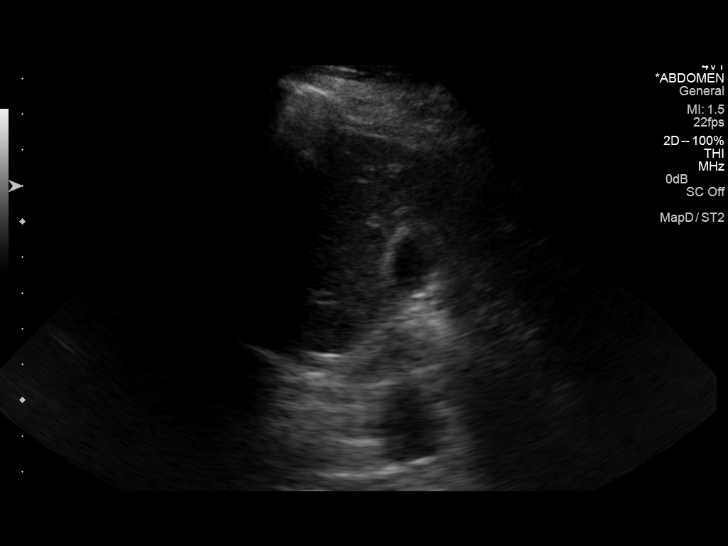
[im 22/24]
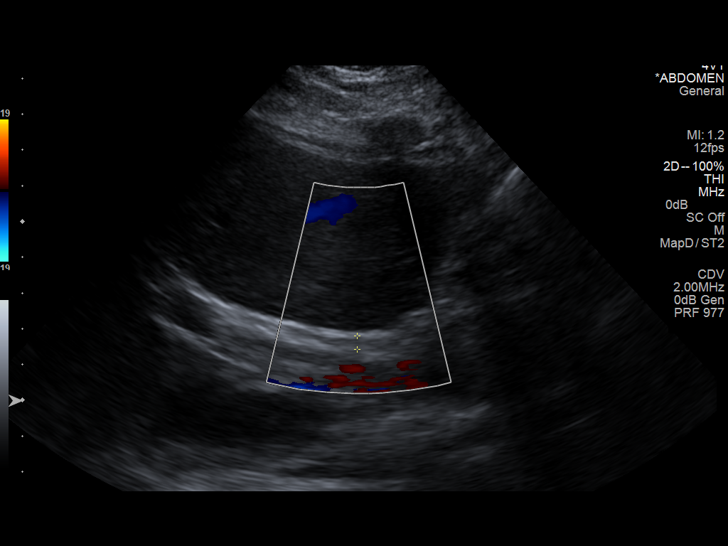
[im 24/24]
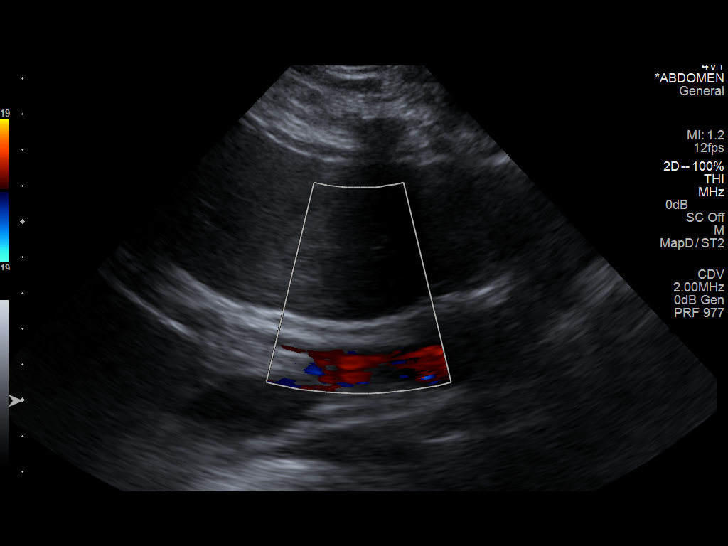

[14 of 24 positions shown; findings below may reference images not displayed]

FINDINGS: Gallbladder:

No gallstones or wall thickening visualized. No sonographic Murphy
sign noted.

Common bile duct:

Diameter: 0.4 cm, within normal limits in caliber.

Liver:

No focal lesion identified. Within normal limits in parenchymal
echogenicity.
IMPRESSION: Unremarkable ultrasound of the right upper quadrant.

## 2016-09-08 IMAGING — CT CT ABD-PELV W/ CM
2 of 4 series · 17 of 46 positions shown, 19 images · IV contrast (omnipaque)
Comparison: 05/28/2014 CT

CLINICAL DATA: 38-year-old male with right abdominal and pelvic
pain for 1 week. Initial encounter.

EXAM:
CT ABDOMEN AND PELVIS WITH CONTRAST
TECHNIQUE: Multidetector CT imaging of the abdomen and pelvis was performed
using the standard protocol following bolus administration of
intravenous contrast.
CONTRAST:  100mL OMNIPAQUE IOHEXOL 300 MG/ML  SOLN

[Series 2: abd/ pelvis 5.0 i30f 1 · axial · 0.76mm/px · z∈[-641,-201]mm · 14 of 96 slices shown, 16 images]
[im 4/96  soft-tissue]
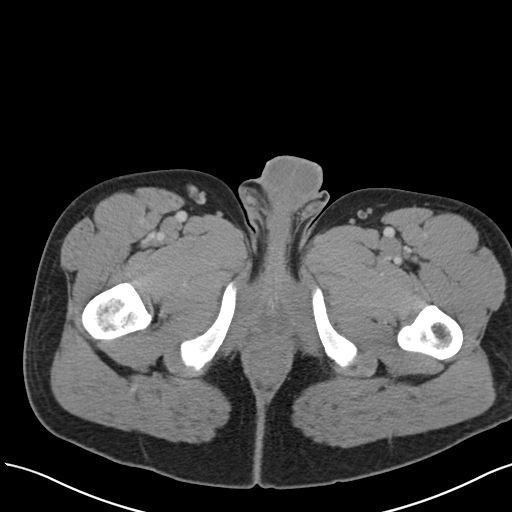
[im 4/96  bone]
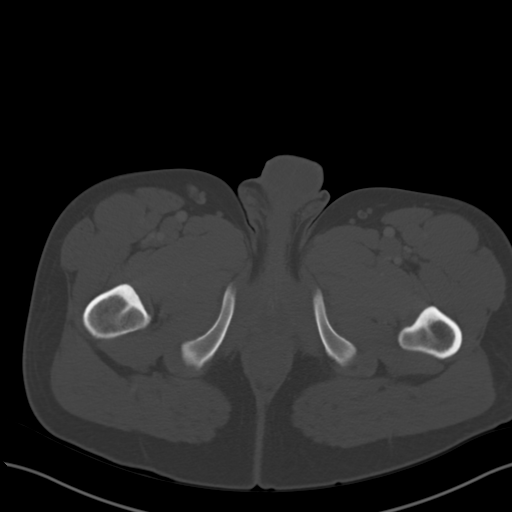
[im 12/96  soft-tissue]
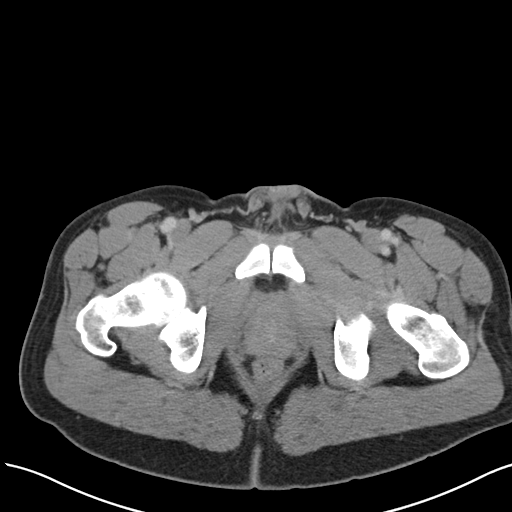
[im 20/96  soft-tissue]
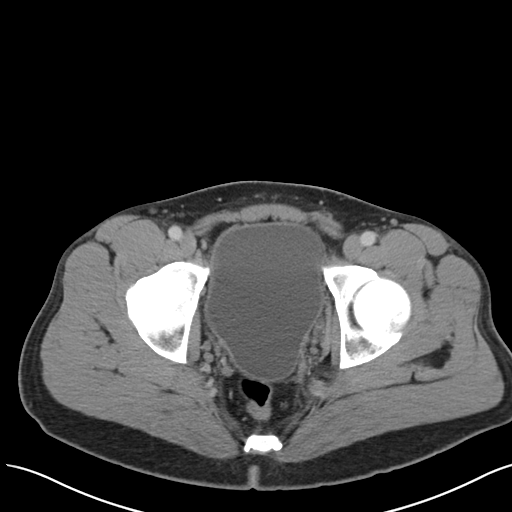
[im 27/96  soft-tissue]
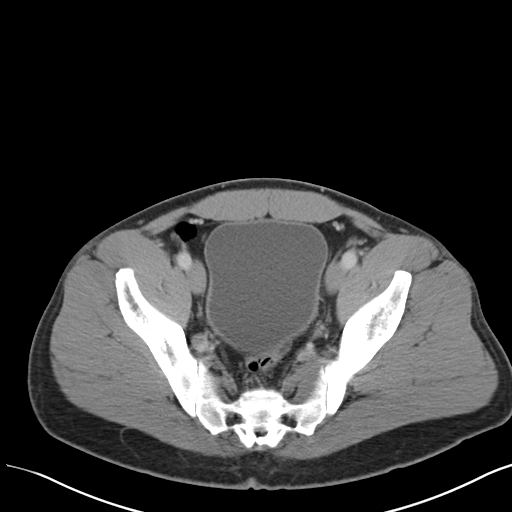
[im 31/96  soft-tissue]
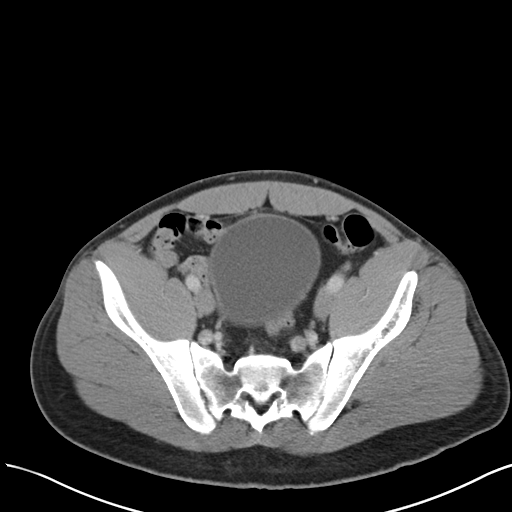
[im 39/96  soft-tissue]
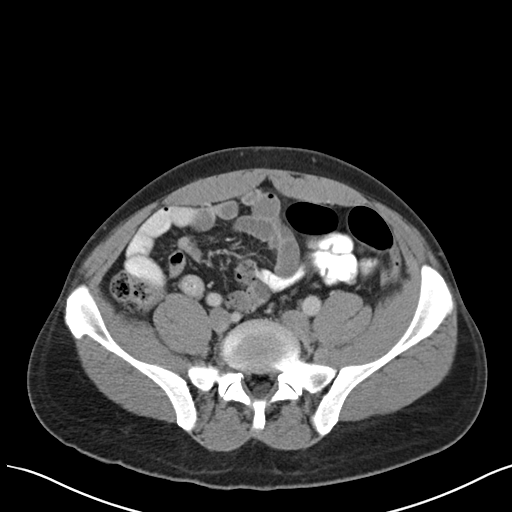
[im 46/96  soft-tissue]
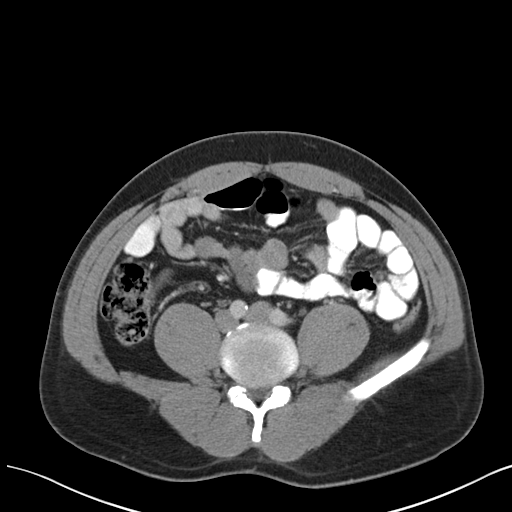
[im 50/96  soft-tissue]
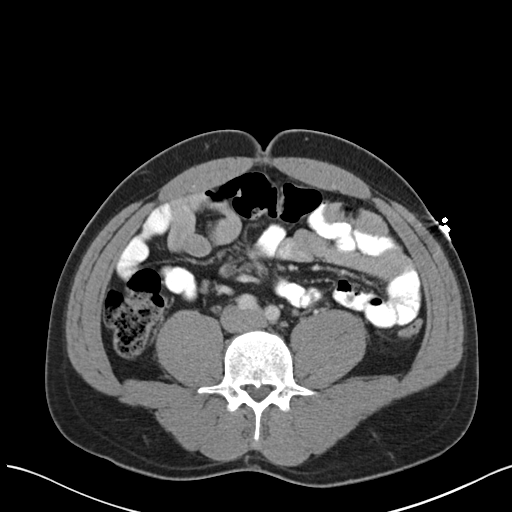
[im 58/96  soft-tissue]
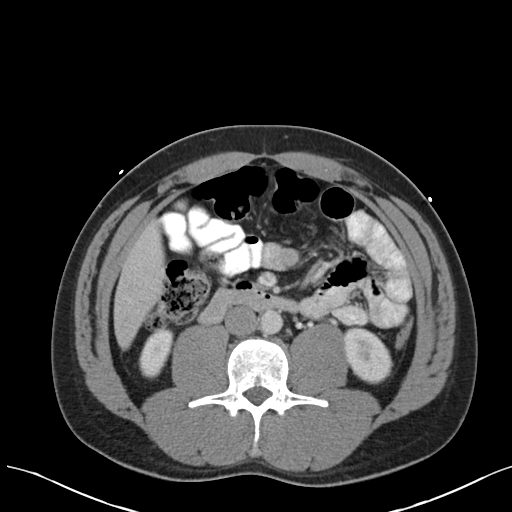
[im 58/96  bone]
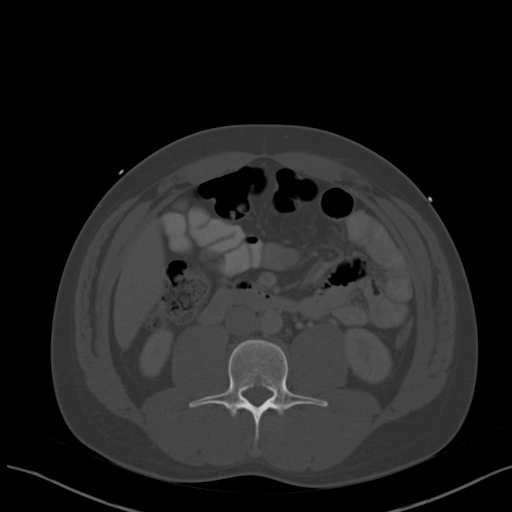
[im 65/96  soft-tissue]
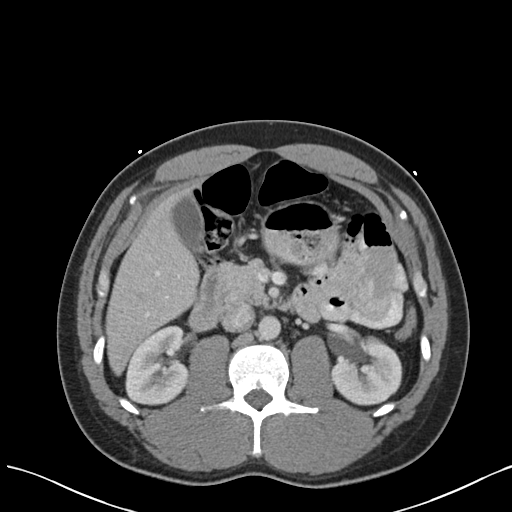
[im 73/96  soft-tissue]
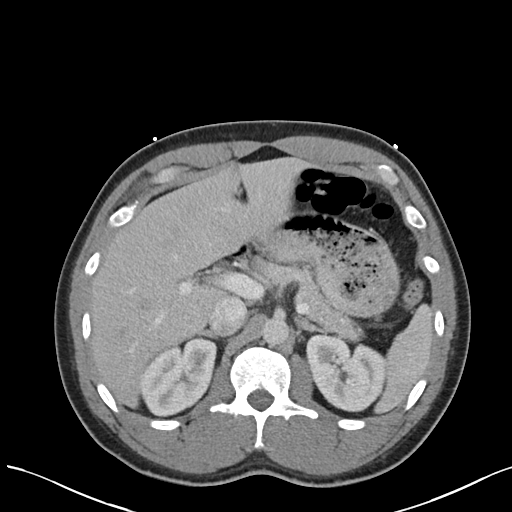
[im 77/96  soft-tissue]
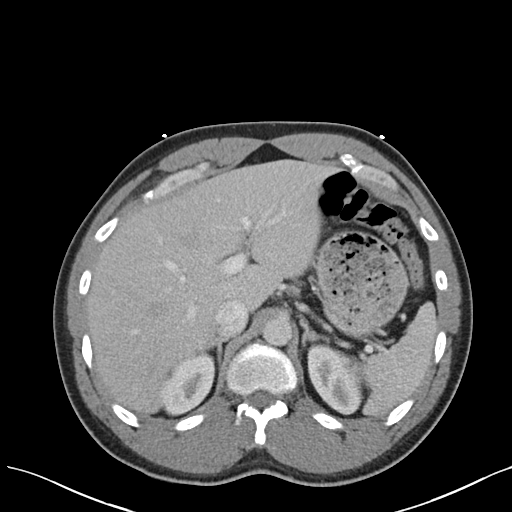
[im 84/96  soft-tissue]
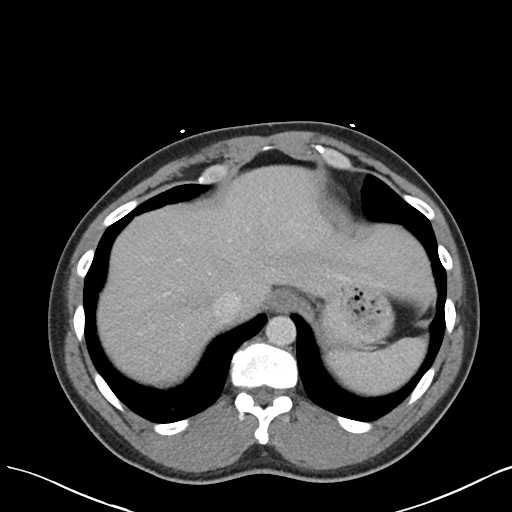
[im 92/96  soft-tissue]
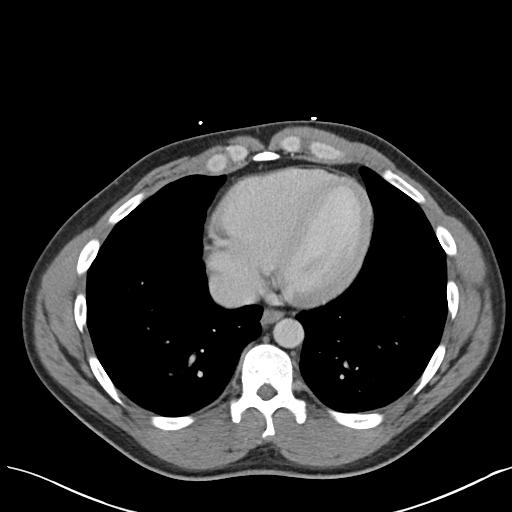

[Series 602: coronal · coronal · 0.93mm/px · 3 of 53 slices shown]
[im 18/53  soft-tissue]
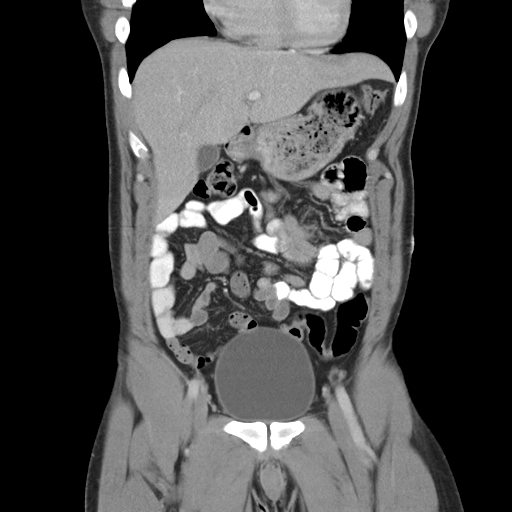
[im 24/53  soft-tissue]
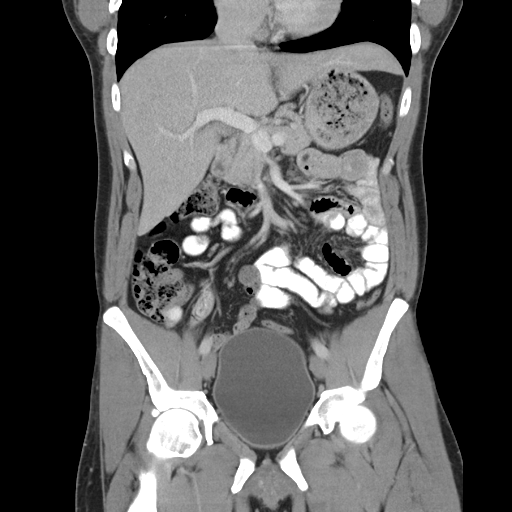
[im 29/53  soft-tissue]
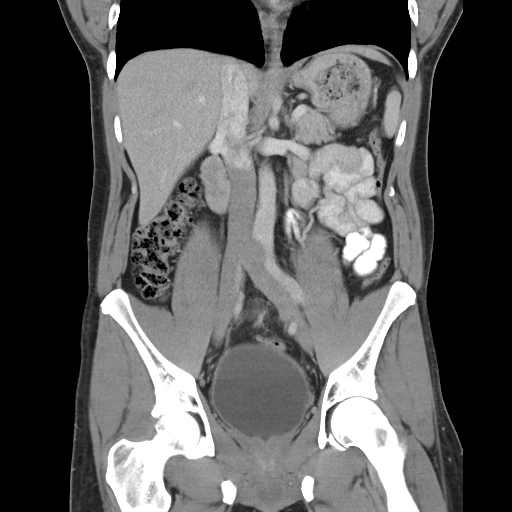

[17 of 46 positions shown; findings below may reference images not displayed]

FINDINGS: The liver, gallbladder, spleen, pancreas, adrenal glands, and
kidneys are unremarkable.

There is no evidence of free fluid, enlarged lymph nodes, biliary
dilation or abdominal aortic aneurysm.

Again noted is focal enlargement of the mid-distal appendix without
involvement of the tip. No adjacent inflammation is identified.
Although this could be unusual appearance of a normal appendix,
early appendiceal mass is difficult to exclude.

The remainder of the bowel is unremarkable.

There is no evidence of bowel obstruction, pneumoperitoneum or
abscess.

Bladder is unremarkable.

No acute or suspicious bony abnormalities are identified.

A broad-based disc-osteophyte complex at L4-5 causes moderate
central spinal and lateral recess narrowing.
IMPRESSION: No evidence of acute abnormality.

Focal enlargement of the mid-distal appendix without evidence of
appendicitis. Although this could represent an unusual appearance of
a normal appendix, focal wall thickening/ early appendiceal mass is
not excluded. Consider short term CT follow-up or surgical
consultation.

Broad-based disc-osteophyte complex at L4-5 causes moderate central
spinal lateral recess narrowing.
